# Patient Record
Sex: Male | Born: 1985 | Hispanic: Yes | Marital: Married | Smoking: Never smoker
Health system: Southern US, Community
[De-identification: ages and names within clinical notes are randomized; demographics above are authoritative.]

---

## 2013-06-14 ENCOUNTER — Other Ambulatory Visit: Payer: Self-pay | Admitting: Infectious Disease

## 2013-06-14 ENCOUNTER — Ambulatory Visit
Admission: RE | Admit: 2013-06-14 | Discharge: 2013-06-14 | Disposition: A | Discharge status: Home / Self Care | Payer: No Typology Code available for payment source | Source: Ambulatory Visit | Attending: Infectious Disease | Admitting: Infectious Disease

## 2013-06-14 DIAGNOSIS — A150 Tuberculosis of lung: Secondary | ICD-10-CM

## 2013-10-10 ENCOUNTER — Emergency Department (HOSPITAL_COMMUNITY): Payer: Self-pay

## 2013-10-10 ENCOUNTER — Emergency Department (HOSPITAL_COMMUNITY)
Admission: EM | Admit: 2013-10-10 | Discharge: 2013-10-10 | Disposition: A | Discharge status: Home / Self Care | Payer: Self-pay | Attending: Emergency Medicine | Admitting: Emergency Medicine

## 2013-10-10 ENCOUNTER — Encounter (HOSPITAL_COMMUNITY): Payer: Self-pay | Admitting: Emergency Medicine

## 2013-10-10 DIAGNOSIS — S022XXA Fracture of nasal bones, initial encounter for closed fracture: Secondary | ICD-10-CM | POA: Insufficient documentation

## 2013-10-10 DIAGNOSIS — S0083XA Contusion of other part of head, initial encounter: Secondary | ICD-10-CM | POA: Insufficient documentation

## 2013-10-10 DIAGNOSIS — F10929 Alcohol use, unspecified with intoxication, unspecified: Secondary | ICD-10-CM

## 2013-10-10 DIAGNOSIS — S0990XA Unspecified injury of head, initial encounter: Secondary | ICD-10-CM | POA: Insufficient documentation

## 2013-10-10 DIAGNOSIS — F1010 Alcohol abuse, uncomplicated: Secondary | ICD-10-CM | POA: Insufficient documentation

## 2013-10-10 DIAGNOSIS — S1093XA Contusion of unspecified part of neck, initial encounter: Secondary | ICD-10-CM

## 2013-10-10 DIAGNOSIS — Z23 Encounter for immunization: Secondary | ICD-10-CM | POA: Insufficient documentation

## 2013-10-10 DIAGNOSIS — S0180XA Unspecified open wound of other part of head, initial encounter: Secondary | ICD-10-CM | POA: Insufficient documentation

## 2013-10-10 DIAGNOSIS — S0181XA Laceration without foreign body of other part of head, initial encounter: Secondary | ICD-10-CM

## 2013-10-10 DIAGNOSIS — S0003XA Contusion of scalp, initial encounter: Secondary | ICD-10-CM | POA: Insufficient documentation

## 2013-10-10 LAB — CBC WITH DIFFERENTIAL/PLATELET
BASOS ABS: 0 10*3/uL (ref 0.0–0.1)
Basophils Relative: 0 % (ref 0–1)
Eosinophils Absolute: 0.1 10*3/uL (ref 0.0–0.7)
Eosinophils Relative: 1 % (ref 0–5)
HCT: 39.2 % (ref 39.0–52.0)
Hemoglobin: 13.4 g/dL (ref 13.0–17.0)
LYMPHS ABS: 1.8 10*3/uL (ref 0.7–4.0)
LYMPHS PCT: 23 % (ref 12–46)
MCH: 28.6 pg (ref 26.0–34.0)
MCHC: 34.2 g/dL (ref 30.0–36.0)
MCV: 83.6 fL (ref 78.0–100.0)
Monocytes Absolute: 0.4 10*3/uL (ref 0.1–1.0)
Monocytes Relative: 5 % (ref 3–12)
NEUTROS ABS: 5.7 10*3/uL (ref 1.7–7.7)
Neutrophils Relative %: 71 % (ref 43–77)
PLATELETS: 239 10*3/uL (ref 150–400)
RBC: 4.69 MIL/uL (ref 4.22–5.81)
RDW: 13.5 % (ref 11.5–15.5)
WBC: 8 10*3/uL (ref 4.0–10.5)

## 2013-10-10 LAB — RAPID URINE DRUG SCREEN, HOSP PERFORMED
Amphetamines: NOT DETECTED
Barbiturates: NOT DETECTED
Benzodiazepines: NOT DETECTED
COCAINE: NOT DETECTED
Opiates: NOT DETECTED
TETRAHYDROCANNABINOL: NOT DETECTED

## 2013-10-10 LAB — COMPREHENSIVE METABOLIC PANEL
ALK PHOS: 94 U/L (ref 39–117)
ALT: 14 U/L (ref 0–53)
AST: 29 U/L (ref 0–37)
Albumin: 3.8 g/dL (ref 3.5–5.2)
BUN: 12 mg/dL (ref 6–23)
CALCIUM: 8 mg/dL — AB (ref 8.4–10.5)
CHLORIDE: 100 meq/L (ref 96–112)
CO2: 22 meq/L (ref 19–32)
Creatinine, Ser: 0.88 mg/dL (ref 0.50–1.35)
GFR calc Af Amer: >90 mL/min (ref >90)
GFR calc non Af Amer: >90 mL/min (ref >90)
Glucose, Bld: 100 mg/dL — ABNORMAL HIGH (ref 70–99)
POTASSIUM: 3.2 meq/L — AB (ref 3.7–5.3)
SODIUM: 138 meq/L (ref 137–147)
Total Protein: 7.5 g/dL (ref 6.0–8.3)

## 2013-10-10 LAB — ETHANOL: Alcohol, Ethyl (B): 249 mg/dL — ABNORMAL HIGH (ref 0–11)

## 2013-10-10 MED ORDER — TRAMADOL HCL 50 MG PO TABS: 50.0000 mg | ORAL_TABLET | Freq: Four times a day (QID) | ORAL | Status: AC | PRN

## 2013-10-10 MED ORDER — SODIUM CHLORIDE 0.9 % IV BOLUS (SEPSIS)
1000.0000 mL | Freq: Once | INTRAVENOUS | Status: AC
  Administered 2013-10-10: 1000 mL via INTRAVENOUS

## 2013-10-10 MED ORDER — ONDANSETRON HCL 4 MG/2ML IJ SOLN
4.0000 mg | Freq: Once | INTRAMUSCULAR | Status: AC
  Administered 2013-10-10: 4 mg via INTRAVENOUS
  Filled 2013-10-10: qty 2

## 2013-10-10 MED ORDER — IBUPROFEN 600 MG PO TABS: 600.0000 mg | ORAL_TABLET | Freq: Four times a day (QID) | ORAL | Status: AC | PRN

## 2013-10-10 MED ORDER — TETANUS-DIPHTH-ACELL PERTUSSIS 5-2.5-18.5 LF-MCG/0.5 IM SUSP
0.5000 mL | Freq: Once | INTRAMUSCULAR | Status: AC
  Administered 2013-10-10: 0.5 mL via INTRAMUSCULAR
  Filled 2013-10-10: qty 0.5

## 2013-10-10 MED ORDER — ONDANSETRON 4 MG PO TBDP: ORAL_TABLET | ORAL | Status: AC

## 2013-10-10 MED ORDER — FENTANYL CITRATE 0.05 MG/ML IJ SOLN
50.0000 ug | Freq: Once | INTRAMUSCULAR | Status: AC
  Administered 2013-10-10: 50 ug via INTRAVENOUS
  Filled 2013-10-10: qty 2

## 2013-10-10 NOTE — ED Provider Notes (Signed)
LACERATION REPAIR Performed by: Dorthula Matas Authorized by: Dorthula Matas Consent: Verbal consent obtained. Risks and benefits: risks, benefits and alternatives were discussed Consent given by: patient Patient identity confirmed: provided demographic data Prepped and Draped in normal sterile fashion Wound explored  Laceration Location: multiple sites to face x 3 ( see Dr. Wonda Amis note)  Laceration Length:  1 cm  No Foreign Bodies seen or palpated  Anesthesia: local infiltration  Local anesthetic: lidocaine 2% wo epinephrine  Anesthetic total: 4 ml  Irrigation method: syringe Amount of cleaning: standard  Skin closure: sutures  Number of sutures: 2 sutures x two wounds and 1 sutures x one wound  Technique: simple interrupted  Patient tolerance: Patient tolerated the procedure well with no immediate complications.  For HPI, ROS, PE and dispo refer to Dr. Posey Rea note.   Dorthula Matas, PA-C 10/10/13 [phone]

## 2013-10-10 NOTE — ED Notes (Signed)
Pt reminded urine specimen is needed.  Tiffany, PA at bedside suturing.

## 2013-10-10 NOTE — ED Notes (Signed)
Pt aware urine specimen is needed. 

## 2013-10-10 NOTE — ED Provider Notes (Signed)
Medical screening examination/treatment/procedure(s) were performed by non-physician practitioner and as supervising physician I was immediately available for consultation/collaboration.   EKG Interpretation None        Windy Dudek, MD 10/10/13 0744 

## 2013-10-10 NOTE — ED Notes (Addendum)
Pt returned from CT °

## 2013-10-10 NOTE — ED Notes (Signed)
Pt informed of need for urine specimen  
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
## 2013-10-10 NOTE — ED Notes (Signed)
Ice pack given for eye- pt family reminded pt should not have anything to drink.

## 2013-10-10 NOTE — ED Notes (Signed)
Gave patient ginger ale and crackers for PO challenge.  

## 2013-10-10 NOTE — Discharge Instructions (Signed)
Followup with the ear, nose and throat MD in one week to reassess your nasal fractures. Call and make an appointment to followup with the ophthalmologist in the next few days to reassess your left eye. Return to the emergency department or your primary Dr. and 5-7 days to have your sutures removed. Return immediately for any worsening pain, focal weakness, vision changes, persistent vomiting or for any concerns.  Concusin - Adultos (Concussion, Adult) Una concusin, o traumatismo cerebral cerrado, es una lesin cerebral causada por un golpe directo en la cabeza o por un movimiento rpido y brusco sacudida) de la cabeza o el cuello. Generalmente no pone en peligro la vida. An as, los efectos de una concusin pueden ser graves. Si sufri una concusin antes, es ms probable que experimente sntomas similares a una concusin despus de un golpe directo en la cabeza.  CAUSAS   Un golpe directo en la cabeza, como al chocar contra otro jugador en un partido de ftbol, recibir un golpe en una lucha o golpearse la cabeza con una superficie dura.  Una sacudida de la cabeza o el cuello que hace que el cerebro se mueva de adelante hacia atrs dentro del crneo, como en un choque automovilstico. SIGNOS Y SNTOMAS  Los signos de una concusin pueden ser difciles de Chief Strategy Officer. En un primer momento, los pacientes, familiares y profesionales tal vez no los adviertan. Puede ser que aparentemente est normal pero que acte o se sienta diferente. Los sntomas son transitorios pero generalmente duran [address], [address] o an ms. Algunos sntomas pueden aparecer inmediatamente mientras otros pueden manifestarse despus de algunas horas o [address]  [address]. Cada lesin en la cabeza es diferente. Los sntomas son:   Dolor de cabeza leve a moderado, que no se [place].  Sensacin de presin dentro de la cabeza.  Presentar ms dificultad que lo habitual para:  Aprender o recordar cosas que ha escuchado.  Responder  preguntas.  Prestar atencin o concentrarse.  Organizar las tareas diarias.  Tomar decisiones y [employer].  Lentitud para pensar, actuar, hablar o leer.  Sentirse perdido o fcilmente confundido.  Sentirse cansado todo el tiempo o con falta de Engineer, drilling (fatigado).  Sentirse somnoliento.  Trastornos del sueo.  Dormir ms que lo habitual.  Dormir menos que lo habitual.  Problemas para conciliar el sueo.  Problemas para dormir (insomnio).  Prdida del equilibrio, sensacin de mareo.  Nuseas o vmitos.  Adormecimiento u hormigueo.  Mayor sensibilidad para:  Los sonidos.  Las luces.  Distracciones.  Problemas visuales o fcil cansancio en los ojos.  Prdida del sentido del gusto o Cabin crew.  Pitidos en el odo.  Cambios en el humor como sentirse triste o ansioso.  Irritacin, enojo por cosas pequeas o sin motivos.  Falta de motivacin.  Ver o escuchar cosas que otras personas no ven ni escuchan (alucinaciones). DIAGNSTICO  El mdico diagnosticar una concusin basndose en la descripcin del traumatismo y los sntomas. Le preguntar si sufri un desmayo (perdi la conciencia) y si tiene dificultad para recordar los sucesos ocurridos inmediatamente antes y Therapist, sports traumatismo.  La evaluacin tambin puede incluir:   Un escner cerebral para encontrar signos de lesin cerebral. Aunque los estudios no [employer] Sciences Corporation, igual puede haber sufrido una concusin.  Anlisis de sangre para asegurarse de que no hay otros problemas. TRATAMIENTO   La mayor parte de las concusiones se tratan en el servicio de emergencias o en el consultorio mdico. Es posible que Hydrologist en el hospital durante la noche  para completar el tratamiento.  Informe a su mdico si toma medicamentos, incluyendo los medicamentos recetados, medicamentos de venta libre y remedios naturales. Algunos medicamentos, como los anticoagulantes y la aspirina, pueden aumentar la  probabilidad de sufrir complicaciones. Tambin informe a su mdico si ha bebido alcohol o consume drogas. Esta informacin puede Licensed conveyancerafectar el tratamiento.  El profesional le dar el alta con algunas instrucciones que deber seguir.  La velocidad de recuperacin de una concusin depende de varios factores. Entre ellos se incluyen la gravedad de la concusin, la zona del cerebro lesionada, la edad y [place] estado de salud previo a la lesin.  La Harley-Davidsonmayora de las personas que han sufrido una lesin leve se recuperan completamente. La recuperacin puede llevar tiempo. En general, la recuperacin es ms lenta en las Smith Internationalpersonas mayores. A aquellas personas que ya han sufrido una concusin en el pasado, les llevar ms tiempo recuperarse de la lesin actual. INSTRUCCIONES PARA EL CUIDADO EN EL HOGAR  Instrucciones generales   Siga cuidadosamente las indicaciones que su mdico le ha dado.  Slo tome medicamentos de venta libre o recetados para Primary school teachercalmar el dolor, Environmental health practitionerel malestar o bajar la Newsomsfiebre, segn las indicaciones de su mdico.  Tome slo los medicamentos que su mdico le haya autorizado.  No beba alcohol hasta que su mdico lo autorice. El alcohol y otras sustancias pueden demorar su recuperacin y ponerlo en riesgo de nuevas lesiones.  Si le resulta ms difcil que lo habitual recordar las cosas, escrbalas.  Trate de hacer una cosa por vez si se distrae con facilidad. Por ejemplo, no mire televisin mientras prepara la cena.  Consulte con familiares y amigos si debe tomar decisiones importantes.  Cumpla con todas las visitas de control. Se recomienda realizar varias evaluaciones de los sntomas para favorecer su recuperacin.  Controle sus sntomas y pdale a otras personas que tambin lo hagan. En algunos casos pueden aparecer complicaciones luego de una concusin. Los ONEOKadultos mayores con lesin cerebral Conservator, museum/gallerypueden tener un mayor riesgo de complicaciones importantes como la formacin de un cogulo de sangre  en el cerebro.  Informe a los Kelly Servicesmaestros, al departamento de enfermera de la escuela, al consejero escolar, Conservation officer, historic buildingsentrenador o director acerca de los sntomas y Engineer, structuralrestricciones que tiene. Hgales saber lo que puede y lo que no Scientist, product/process developmentpuede hacer. Ellos debern observar:  Aumento en los problemas de atencin o Librarian, academicconcentracin.  Aumento en dificultades de memoria o en el aprendizaje de informacin nueva.  Aumento del tiempo que necesita para completar tareas o encargos.  Aumento de la irritabilidad o disminucin de la capacidad para Social workerenfrentar el estrs.  Aparicin de nuevos sntomas.  Reposo. El descanso favorece la curacin del cerebro. Asegrese de que:  Duerme bien por la noche. Evite quedarse despierto muy tarde por la noche.  Debe irse a dormir a la VF Corporationmisma hora los das de [address]man[address] y los fines de North Starsemana.  Descanse Administratordurante el da. Promueva las siestas durante el da o momentos de descanso cuando parece cansado.  Limite las actividades que requieren mucho atencin o Librarian, academicconcentracin. Ellas son:  Tareas para el hogar o trabajos relacionados con el empleo.  Mirar televisin.  Trabajar en la computadora.  Evite toda situacin en la que se pudiera producir otra lesin cerebral (ftbol, hockey, artes marciales, andar a caballo). El problema empeorar cada vez que experimente un sndrome postconmocional. Debe evitar estas actividades hasta que sea evaluado por el mdico correspondiente. Regreso a las Starwood Hotelsactividades habituales El regreso a las actividades habituales debe ser gradual. NewellEl  cuerpo y el cerebro necesitan su tiempo para recuperarse.  No retome la prctica de deportes ni otras actividades deportivas hasta que su mdico le diga que puede [place].  Consulte a su mdico sobre cundo puede conducir automviles, andar en bicicleta u operar maquinarias pesadas. La capacidad para reaccionar puede ser ms lenta luego de una lesin cerebral. Nunca realice estas actividades si se siente mareado.  Pregunte a su  mdico cuando podr volver a la escuela o al Aleen Campi. Prevencin de otra concusin Es muy importante que evite otro golpe en la cabeza, especialmente antes de que se haya recuperado. En casos raros, un nuevo traumatismo puede causar daos cerebrales permanentes, hinchazn del cerebro y [employer]. El riesgo es mayor durante los primeros 7 a 10 das despus de una lesin en la cabeza. Evite los traumatismos:   Use el cinturn de seguridad al conducir su automvil.  Beba alcohol slo con moderacin.  Use un casco cuando ande en bicicleta, esque, patine o realice actividades similares.  Evite actividades que podran causar una segunda conmocin cerebral, como deportes de contacto o recreativos hasta que su mdico lo autorice.  Implemente medidas de seguridad en el hogar.  Evite el desorden en pisos y escaleras.  Coloque barras en los baos y pasamanos en las escaleras.  Ponga alfombras antideslizantes en pisos y baeras.  Mejore la iluminacin en zonas de penumbra. SOLICITE ATENCIN MDICA SI:   Aumentan sus problemas de atencin o concentracin.  Aumentan las dificultades en la memoria o en el aprendizaje de informacin nueva.  Necesita ms tiempo que antes para completar las tareas o asignaciones.  Aumenta la irritabilidad o disminuye la capacidad para Social worker.  Tiene ms sntomas que antes. Solicite atencin mdica si presenta alguno de los siguientes sntomas durante ms de 2 semanas despus de su lesin:   Cefaleas que duran mucho (crnica).  Mareos o problemas con el equilibrio.  Nuseas.  Problemas de visin.  Aumento de la sensibilidad a los ruidos o a Statistician.  Depresin y cambios en el estado de nimo.  Ansiedad o irritabilidad.  Tiene problemas de [place].  Dificultad para concentrarse o Engineer, technical sales.  Problemas para dormir.  Cansancio permanente. SOLICITE ATENCIN MDICA DE INMEDIATO SI:   Los dolores de [place] son intensos o  empeoran. Esto puede ser un signo de que hay un cogulo en la cabeza.  Siente debilidad (an si es slo en Edison Simon, una pierna o parte del rostro).  Siente entumecimiento.  Le falta coordinacin.  Vomita repetidas veces.  Est ms somnoliento.  Una pupila est ms grande que la otra.  Sufre convulsiones.  Tiene dificultad para hablar.  Se siente repentinamente confuso. Esto puede ser un signo de que hay un cogulo en el cerebro.  Aumenta la confusin, la agitacin o la irritabilidad.  No puede Nutritional therapist o lugares.  Siente dolor en el cuello.  Le resulta difcil despertarse.  Tiene cambios de conducta inusuales.  Pierde la conciencia. ASEGRESE DE QUE:   Comprende estas instrucciones.  Controlar su afeccin.  Recibir ayuda de inmediato si no mejora o si empeora. Document Released: 04/11/2008 Document Revised: 12/30/2012 [facility] Patient Information 2014 [place], [place].  Laceracin facial (Facial Laceration)  Una laceracin facial es un corte en el rostro. Estas lesiones pueden ser dolorosas y [place]. Generalmente se curan rpido, pero puede ser necesario un cuidado especial para reducir las cicatrices. DIAGNSTICO  Su mdico realizar la historia clnica, preguntar detalles sobre cmo ocurri la lesin  y examinar la herida para determinar cun profundo es el corte. TRATAMIENTO  Algunas laceraciones faciales no requieren sutura. Otras laceraciones quizs no puedan cerrarse debido a un aumento del riesgo de infeccin. El riesgo de infeccin y la probabilidad de que la herida se cierre correctamente dependern de diversos factores, incluido el tiempo transcurrido desde que ocurri la lesin. La herida puede limpiarse para ayudar a prevenir una infeccin. Si la herida se cierra adecuadamente, podrn indicarle analgsicos, si los necesita. Su mdico usar puntos (suturas), pegamento para heridas Turner Daniels) o tiras [phone] para la piel para Building surveyor. Estos elementos mantendrn unidos los bordes de la piel para que se cure ms rpidamente y para Barista un mejor resultado cosmtico. Si es necesario, es posible que le administren una vacuna contra el ttanos. INSTRUCCIONES PARA EL CUIDADO EN EL HOGAR  Tome solo medicamentos de venta libre o recetados, segn las indicaciones del mdico.  Siga las indicaciones de su mdico para el cuidado de la herida. Estas indicaciones variarn segn la tcnica [place] para cerrar la herida. Si tiene suturas:  Mantenga la herida limpia y [place].  Si le colocaron una venda (vendaje), debe cambiarla al menos una vez al da. Adems, cambie el vendaje si este se moja o se ensucia, o segn las instrucciones de su mdico.  Lave la herida dos veces por da con agua y [place]. Enjuguela con agua para quitar todo el [place]. Seque dando palmaditas con una toalla limpia y seca.  Despus de limpiar, aplique una delgada capa del ungento con antibitico recomendado por su mdico. Esto le ayudar a prevenir las infecciones y a Automotive engineer que el vendaje se Building services engineer.  Puede ducharse despus de las primeras 24 horas. No moje la herida hasta que le hayan quitado las suturas.  Qutese las suturas segn las indicaciones de su mdico. Con las laceraciones faciales, las suturas normalmente deben retirarse despus de 4 a 5das para evitar las marcas de los puntos.  Espere algunos [employer] de que le hayan retirado las suturas antes de Garment/textile technologist. En caso que tenga tiras [id number] para la piel:  Mantenga la herida limpia y seca.  No deje que las tiras [address] se mojen. Puede darse un bao pero asegrese de que la herida se mantenga seca.  Si se moja, squela dando palmaditas con una toalla limpia.  Las tiras caern por s mismas. Puede recortar las tiras a medida que la herida se [place]. No quite las tiras [place] para la piel que an estn adheridas a la herida. Ellas se caern cuando sea el momento. En  caso que le hayan aplicado adhesivo:  Podr mojar la herida solo por un memento, en la ducha o el bao. No frote ni sumerja la herida. No practique natacin. Evite transpirar con abundancia hasta que el [place] para la piel se haya cado solo. Despus de ducharse o darse un bao, seque el corte dando palmaditas con una toalla limpia.  No aplique medicamentos lquidos, en crema o ungentos ni maquillaje en su herida mientras el [employer]ALCOMM para la piel est en su lugar. Podr aflojarlo antes de que la herida se cure.  Si tiene un vendaje, tenga cuidado de no aplicar cinta adhesiva directamente [employer]. Esto puede hacer que el [place] se caiga antes de que la herida se haya curado.  Evite la exposicin prolongada a Secretary/administrator o a las Sport and exercise psychologist el adhesivo para la piel se Arts administrator.  Por lo general, el [employer]COMM  para la Adult nurse durante 5 a 10das y Therapist, occupational. No quite la pelcula de [place]. Despus de la curacin: Una vez que la herida se haya curado, proteja la herida del sol durante un ao, colocando pantalla Statistician. Esto puede ayudar a reducir las cicatrices. La exposicin a los Hydrographic surveyor la Training and development officer. Pueden transcurrir entre uno y dosaos hasta que la cicatriz se cure completamente y pierda el color rojo.  SOLICITE ATENCIN MDICA DE INMEDIATO SI:  Tiene enrojecimiento, dolor o hinchazn alrededor de la herida.  Observa una secrecin de color blanco amarillento (pus) en la herida.  Tiene escalofros o fiebre. ASEGRESE DE QUE:  Comprende estas instrucciones.  Controlar su afeccin.  Recibir ayuda de inmediato si no mejora o si empeora. Document Released: 04/29/2005 Document Revised: 02/17/2013 [facility] Patient Information 2014 [place], [place].  Cuidados de Hotel manager - Adultos  (Laceration Care, Adult)  Una herida cortante es un corte o lesin  que atraviesa todas las capas de la piel y el tejido que se encuentra debajo de la piel.  TRATAMIENTO  Algunas laceraciones no requieren sutura. Algunas no deben cerrarse debido a que puede aumentar el riesgo de infeccin. Es importante que consulte al mdico lo antes posible despus de recibir una lesin para minimizar el riesgo de infeccin y aumentar la posibilidad de que se cierre con xito.  Cuando se cierra adecuadamente, podrn indicarle analgsicos, si los necesita. La herida debe limpiarse para combatir la infeccin. El mdico usar puntos (suturas), [employer], o tiras [place] para Environmental consultant. Estos elementos mantendrn unidos los bordes de la piel para que se cure ms rpidamente y para un mejor resultado cosmtico. Sin embargo, todas las heridas se curarn con una cicatriz. Una vez que la herida se haya curado, las cicatrices pueden minimizarse cubriendo la herida con pantalla solar durante el da por un lapso se 1 ao.  INSTRUCCIONES PARA EL CUIDADO EN EL HOGAR  Si tiene puntos o grapas:   Mantenga la herida limpia y [place].  Si tiene un (vendaje) cmbielo al menos una vez al da. Cmbielo si se moja o se ensucia, o segn las indicaciones del mdico.  Lave el corte dos veces por da con agua y [place]. Enjuguelo con agua para quitar todo el [place]. Seque dando palmaditas con una toalla limpia y seca.  Despus de limpiar, aplique una delgada capa de una crema con antibitico segn las indicaciones del mdico. Esto le ayudar a prevenir las infecciones y a Automotive engineer que el vendaje se Building services engineer.  Puede ducharse despus de las primeras 24 horas. No remoje la herida en agua hasta que le hayan quitado los puntos.  Solo tome medicamentos que se pueden comprar sin receta o recetados para Chief Technology Officer, Dentist o fiebre, como le indica el mdico.  Concurra para que le retiren los puntos o las grapas cuando el mdico le indique. En caso que tenga tiras [id number]:   Mantenga la herida  limpia y seca.  No deje que las tiras se mojen. Puede darse un bao cuidando de [employer] herida seca.  Si se moja, squela dando palmaditas con una toalla limpia.  Las tiras caern por s mismas. Puede recortar las tiras a medida que la herida se [place]. No quite las tiras que estn pegadas a la herida. Ellas se caern cuando sea el momento. En caso que le hayan [place].   Podr mojara momentneamente la herida en la ducha o  el bao. No frote ni sumerja la herida. No practique natacin. Evite transpirar con abundancia hasta que el [place] se haya cado. Despus de ducharse o darse un bao, seque el corte dando palmaditas con una toalla limpia.  No aplique medicamentos lquidos, en crema o ungentos mientras el QUALCOMM est en su lugar. Podr aflojarlo antes de que la herida se cure.  Si tiene un vendaje, tenga cuidado de no aplicar cinta adhesiva directamente [employer]. Esto puede hacer que el [place] se caiga antes de que la herida se haya curado.  Evite la exposicin prolongada a la luz del sol o a la International aid/development worker en QUALCOMM se Arts administrator. La exposicin a los Hydrographic surveyor la Training and development officer.  El [employer]ak la piel durante 5 a 10 das y Therapist, occupational. No quite la pelcula de [place]. Deber aplicarse la vacuna contra el ttanos si:  No recuerda cundo se coloc la vacuna la ltima vez.  Nunca recibi esta vacuna. Si le han aplicado la vacuna contra el ttanos, el brazo podr hincharse, enrojecer y sentirse caliente al tacto. Esto es frecuente y no es un problema. Si usted necesita aplicarse la vacuna y se niega a recibirla, corre riesgo de contraer ttanos. sta es una enfermedad grave.  SOLICITE ATENCIN MDICA SI:   Presenta enrojecimiento, hinchazn o aumento del dolor en la herida.  Hay rayas rojas que salen de la herida.  Observa un lquido blanco amarillento (pus) en la  herida.  Tiene fiebre.  Advierte un olor ftido que proviene de la herida o del vendaje.  La herida se abre luego de que le han extrado las suturas.  Nota que en la herida hay algn cuerpo extrao como un trozo de [place] o vidrio.  La herida est en su mano o pie y observa que no puede mover correctamente los dedos. SOLICITE ATENCIN MDICA DE INMEDIATO SI:   El dolor no se alivia con los [employer].  Hay una zona muy hinchada alrededor de la herida que le causa dolor y adormecimiento, o advierte un cambio en el color en el brazo, la mano, la pierna o el pie.  La herida se abre y sangra nuevamente.  Siente que el adormecimiento, la debilidad o la prdida de la funcin de la articulacin que rodea la herida [place].  Palpa ndulos dolorosos cerca de la herida o bajo la piel en cualquier zona del cuerpo. ASEGRESE DE QUE:   Comprende estas instrucciones.  Controlar su enfermedad.  Solicitar ayuda de inmediato si no mejora o si empeora. Document Released: 04/29/2005 Document Revised: 07/22/2011 [facility] Patient Information 2014 [place], [place].  Fractura de la Clinical cytogeneticist (Nasal Fracture)  La fractura nasal es la rotura de los huesos de la Clinical cytogeneticist. Neomia Dear fractura (quebradura de un hueso) menor generalmente se cura en un mes. Como consecuencia de la fractura de la nariz, los ojos generalmente se ponen negros. Esto no es un motivo de preocupacin. El problema de los ojos negros se resolver (desaparecer) despus de 1  2 semanas.  DIAGNSTICO Si teme que usted o su nio tienen la nariz fracturada, el [employer]oco[employer]ill[employer]. Es posible que las radiografas de la nariz no muestren la [place], aunque sta [place]. En algunos casos, el profesional debe [employer] 1 y [address] despus de la lesin para volver a Systems analyst nariz y Printmaker alineacin y tomar nuevas radiografas. Muchas veces el profesional debe esperar hasta que la hinchazn haya desaparecido. TRATAMIENTO  Una fractura  menor que no ha provocado una deformidad, generalmente no requiere TEFL teacher. Las fracturas ms graves, en las que los huesos se [place], puede requerir [place]. Esta tendr lugar despus que la hinchazn haya disminuido. En la ciruga se estabilizar y alinear la fractura.  INSTRUCCIONES PARA EL CUIDADO DOMICILIARIO  Aplique hielo sobre la zona lesionada.  Ponga el hielo en una bolsa plstica.  Colquese una toalla entre la piel y la bolsa de hielo.  Deje el hielo durante 15 a 20 minutos, 3 a 4 veces por da.  Tome los [employer] como le indic el profesional que lo asiste.  Utilice los medicamentos de venta libre o de prescripcin para Chief Technology Officer, Environmental health practitioner o la [place], segn se lo indique el profesional que lo asiste.  Si la C[employer] Corporation, comprima las partes blandas contra la pared central mientras permanece sentado en posicin erguida.durante 10 minutos.  Debe evitar los deportes de contacto durante al [employer] Corporation 3 o 4 semanas posteriores a una fractura nasal o hasta que el mdico lo autorice. SOLICITE ATENCIN MDICA SI:  El dolor aumenta o se vuelve muy intenso.  Sigue teniendo hemorragias nasales.  La forma de la nariz no vuelve a ser normal [employer] de [address].  Observa pus que proviene de la nariz. SOLICITE ATENCIN MDICA DE INMEDIATO SI:  Sufre una hemorragia nasal que no se detiene despus de 20 minutos de presionar los orificios nasales y de Scientific laboratory technician hielo en la Clinical cytogeneticist.  Observa que un lquido claro drena por la nariz.  Observa una hinchazn similar a una uva en el septum (la pared que divide las fosas nasales) Esto es una acumulacin de sangre (hematoma) que debe drenarse para prevenir la infeccin.  Tiene dificultad para mover los ojos.  Tiene vmitos continuos. Document Released: 02/06/2005 Document Revised: 07/22/2011 [facility] Patient Information 2014 [place], [place].

## 2013-10-10 NOTE — ED Notes (Signed)
Patient transported to CT 

## 2013-10-10 NOTE — ED Notes (Signed)
Tolerated PO Challenge tolerated without incident.
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
## 2013-10-10 NOTE — ED Notes (Signed)
Pt vomited during suturing.  MD made aware.

## 2013-10-10 NOTE — ED Notes (Signed)
Family at bedside.  PT is following commands given in spanish by family.  Pt denies pain at this time,  Sleepy but arousable.  Left eye swollen shut with some bruising. Pt remains in c-collar.  Pt has left lower lip lac.  Pt resting,  No other trauma noted on extremities.  Right pupil 4 RRB.

## 2013-10-10 NOTE — ED Provider Notes (Signed)
Patient signed out by Dr. Ranae Palms pending sobering up.  Patient awake and alert.  Complaining of facial pain.  Able to ambulate and PO without difficulty.  Patient to follow up with opthalmology and ENT per Dr. Ranae Palms.  I feel the patient has been appropriately medically screened and is safe for discharge home. Pertinent diagnoses were discussed with the patient. Patient was given return precautions.   Shon Baton, MD 10/10/13 1026

## 2013-10-15 ENCOUNTER — Encounter (HOSPITAL_COMMUNITY): Payer: Self-pay | Admitting: Emergency Medicine

## 2013-10-15 ENCOUNTER — Emergency Department (HOSPITAL_COMMUNITY)
Admission: EM | Admit: 2013-10-15 | Discharge: 2013-10-15 | Disposition: A | Discharge status: Home / Self Care | Payer: Self-pay | Attending: Emergency Medicine | Admitting: Emergency Medicine

## 2013-10-15 DIAGNOSIS — Z4802 Encounter for removal of sutures: Secondary | ICD-10-CM | POA: Insufficient documentation

## 2013-10-15 NOTE — ED Notes (Signed)
Pt here to have stitches removed above left eye and both corners of mouth. No discharge seen around wounds. Healing look appropriate. Nad, skin warm and dry, resp e/u.

## 2013-10-15 NOTE — ED Provider Notes (Signed)
CSN: [id number]     Arrival date & time 10/15/13  1638 History  This chart was scribed for non-physician practitioner Victor Captain, PA-C working with Victor Horn, MD by Victor Thomas, ED Scribe. This patient was seen in room TR07C/TR07C and the patient's care was started at 8:26 PM .   Chief Complaint  Patient presents with  . Suture / Staple Removal   The history is provided by a relative. No language interpreter was used.   HPI Comments: Victor Thomas is a 28 y.o. male who presents to the Emergency Department for suture removal. Pt is here to have suture removed from above L eye and corners of mouth. Denies discharge, pain, or redness from suture site, and denies pain to eye.   History reviewed. No pertinent past medical history. History reviewed. No pertinent past surgical history. No family history on file. History  Substance Use Topics  . Smoking status: Never Smoker   . Smokeless tobacco: Not on file  . Alcohol Use: Yes    Review of Systems  Eyes: Negative for pain.  Skin: Positive for wound. Negative for color change and rash.   Allergies  Review of patient's allergies indicates no known allergies.  Home Medications   Prior to Admission medications   Medication Sig Start Date End Date Taking? Authorizing Provider  ibuprofen (ADVIL,MOTRIN) 600 MG tablet Take 1 tablet (600 mg total) by mouth every 6 (six) hours as needed. 10/10/13   Victor Racer, MD  ondansetron (ZOFRAN ODT) 4 MG disintegrating tablet 4mg  ODT q4 hours prn nausea/vomit 10/10/13   Victor Racer, MD  traMADol (ULTRAM) 50 MG tablet Take 1 tablet (50 mg total) by mouth every 6 (six) hours as needed. 10/10/13   Victor Racer, MD   BP 140/80  Pulse 88  Temp(Src) 98.7 F (37.1 C) (Oral)  Resp 18  SpO2 98%  Physical Exam  Nursing note and vitals reviewed. Constitutional: He is oriented to person, place, and time. He appears well-developed and well-nourished. No distress.  HENT:  Head:  Normocephalic and atraumatic.  Eyes: EOM are normal.  Neck: Neck supple. No tracheal deviation present.  Cardiovascular: Normal rate.   Pulmonary/Chest: Effort normal. No respiratory distress.  Musculoskeletal: Normal range of motion.  Neurological: He is alert and oriented to person, place, and time.  Skin: Skin is warm and dry.  Psychiatric: He has a normal mood and affect. His behavior is normal.   ED Course  Procedures  DIAGNOSTIC STUDIES: Oxygen Saturation is 98% on RA, normal by my interpretation.    COORDINATION OF CARE: 8:28 PM-Discussed treatment plan which includes encouraged pt to use sunscreen and watch for signs of infection. Pt agreed to plan.   Labs Review Labs Reviewed - No data to display  Imaging Review No results found.   EKG Interpretation None       SUTURE REMOVAL Performed by: Victor Thomas  Consent: Verbal consent obtained. Patient identity confirmed: provided demographic data Time out: Immediately prior to procedure a "time out" was called to verify the correct patient, procedure, equipment, support staff and site/side marked as required.  Location details: eye and mouth  Wound Appearance: clean  Sutures/Staples Removed: 3   Facility: sutures placed in this facility Patient tolerance: Patient tolerated the procedure well with no immediate complications.    MDM   Final diagnoses:  None    Patient here for suture removal. These appear to be healing well and no signs of infection. Patient will be discharged with return  precautions and scar minimization material.  I personally performed the services described in this documentation, which was scribed in my presence. The recorded information has been reviewed and is accurate.    Victor CaptainAbigail Vernice Bowker, PA-C 10/21/13 1742

## 2013-10-15 NOTE — Discharge Instructions (Signed)
Minimizacin de las cicatrices  (Scar Minimization) Cada vez que se pase por una ciruga y se realice un corte en la piel, o le extirpan algo de la piel (lunares, cncer de piel, quiste), tendr Victor Thomas. Aunque las cicatrices son inevitables despus de la ciruga, hay maneras de minimizar su apariencia.  Es importante seguir todas las instrucciones que reciba del mdico sobre el cuidado de la herida. El modo en que se cura la herida influir en la apariencia de la Training and development officer. Si usted no sigue las instrucciones [employer] heridas, puede haber complicaciones, como infecciones. Las instrucciones pare el cuidado de las heridas incluyen la higiene de la herida y Victor Thomas, para que no se forme Victor Thomas. En algunas personas se forman cicatrices que son elevadas y abultadas (hipertrficas)o ms grandes que la herida inicial (queloides).  INSTRUCCIONES PARA EL CUIDADO EN EL HOGAR   Siga las instrucciones de cuidado de las heridas, segn las indicaciones.  Mantenga la herida limpia, lavndola con agua y [place].  Mantenga la herida hmeda con crema con antibitico o vaselina hasta que est completamente curada. Humedezca dos veces al da durante aproximadamente 2 semanas.  Concurra para que Victor Thomas puntos (suturas) en el momento programado.  Evite tocar o manipular la herida a menos que sea necesario. Lvese bien las manos antes y despus de tocar la herida.  Siga todas las restricciones, tales como lmites en el ejercicio o el [place]. Esto depende de dnde se encuentre la cicatriz.  Mantenga la cicatriz protegida de la exposicin al sol. Cubra la cicatriz con la proteccin solar / bloqueador solar con SPF 30 o superior.  Frote suavemente la herida con un movimiento circular para ayudar a Engineer, maintenance (IT) apariencia de la Training and development officer. Haga esto slo despus de que la herida se haya cerrado y todas las suturas se hayan eliminado.  Para las cicatrices hipertrficas o queloides,  hay varias maneras de tratar y minimizar su aparicin. Los mtodos incluyen la terapia de compresin, los corticoides intralesionales, la terapia con lser o la [place]. Estos mtodos los Geographical information systems officer mdico. Recuerde que la cicatriz puede aparecer ms clara o ms oscura que el color de la piel normal. Esta diferencia en el color debe igualarse con el tiempo.  SOLICITE ATENCIN MDICA SI:   Victor Thomas.  Aparecen signos de infeccin como dolor, enrojecimiento, pus y calor.  Tiene preguntas o preocupaciones. Document Released: 04/18/2011 Document Revised: 07/22/2011 [facility] Patient Information 2014 [place], [place].  Remocin de la sutura, cuidados posteriores (Suture Removal, Care After) Siga estas instrucciones durante las prximas semanas. Estas indicaciones le proporcionan informacin general acerca de cmo deber cuidarse despus del procedimiento. El mdico tambin podr darle instrucciones ms especficas. El tratamiento se ha planificado de acuerdo a las prcticas mdicas actuales, pero a veces se producen problemas. Comunquese con el mdico si tiene algn problema o tiene dudas despus del procedimiento. QU ESPERAR DESPUS DEL PROCEDIMIENTO Despus de que le retiren los puntos (suturas), es normal experimentar lo siguiente:  Molestias e hinchazn en la zona de la herida.  Leve enrojecimiento en la zona de la herida. INSTRUCCIONES PARA EL CUIDADO EN EL HOGAR   Si tiene bandas adhesivas en la piel sobre la zona de la herida, no las retire. Se caern solas en unos [employer]. Si las bandas [employer] siguen en su lugar despus de [address], entonces puede retirarlas.  Cambie los apsitos (vendajes), al menos, una vez al da o segn las instrucciones del mdico. Si el  vendaje se adhiere, remjelo con agua jabonosa tibia.  Solo aplique un ungento o crema segn las indicaciones del mdico. Si [place] crema o ungento, lave la zona con agua y [address]n dos veces por da para quitarlo todo.  Enjuague el jabn y seque suavemente la zona con una toalla limpia.  Mantenga la herida limpia y seca. Si el vendaje se moja, se ensucia o tiene mal olor, cmbielo tan pronto como pueda.  Contine protegiendo la herida de lesiones.  Use protector solar cuando salga al sol. Las cicatrices nuevas se broncean fcilmente. SOLICITE ATENCIN MDICA SI:  Aumenta el enrojecimiento, la hinchazn o el dolor en la zona afectada.  Observa que sale pus de la herida.  Tiene fiebre.  Advierte un olor ftido que proviene de la herida o del vendaje.  La herida se abre (los bordes se separan). Document Released: 02/06/2005 Document Revised: 02/17/2013 [facility] Patient Information 2014 [place].

## 2013-10-28 NOTE — ED Provider Notes (Signed)
Medical screening examination/treatment/procedure(s) were performed by non-physician practitioner and as supervising physician I was immediately available for consultation/collaboration.   EKG Interpretation None       Shaylea Ucci M Chou Busler, MD 10/28/13 1306 

## 2013-11-30 ENCOUNTER — Ambulatory Visit
Admission: RE | Admit: 2013-11-30 | Discharge: 2013-11-30 | Disposition: A | Discharge status: Home / Self Care | Payer: No Typology Code available for payment source | Source: Ambulatory Visit | Attending: Infectious Disease | Admitting: Infectious Disease

## 2013-11-30 ENCOUNTER — Other Ambulatory Visit: Payer: Self-pay | Admitting: Infectious Disease

## 2013-11-30 DIAGNOSIS — A150 Tuberculosis of lung: Secondary | ICD-10-CM

## 2014-11-30 IMAGING — CR DG CHEST 1V
1 series · 1 of 1 positions shown · non-contrast
Comparison: Chest x-ray of 07/02/2013

CLINICAL DATA: Completing treatment for tuberculosis

EXAM:
CHEST - 1 VIEW

[view not recorded]
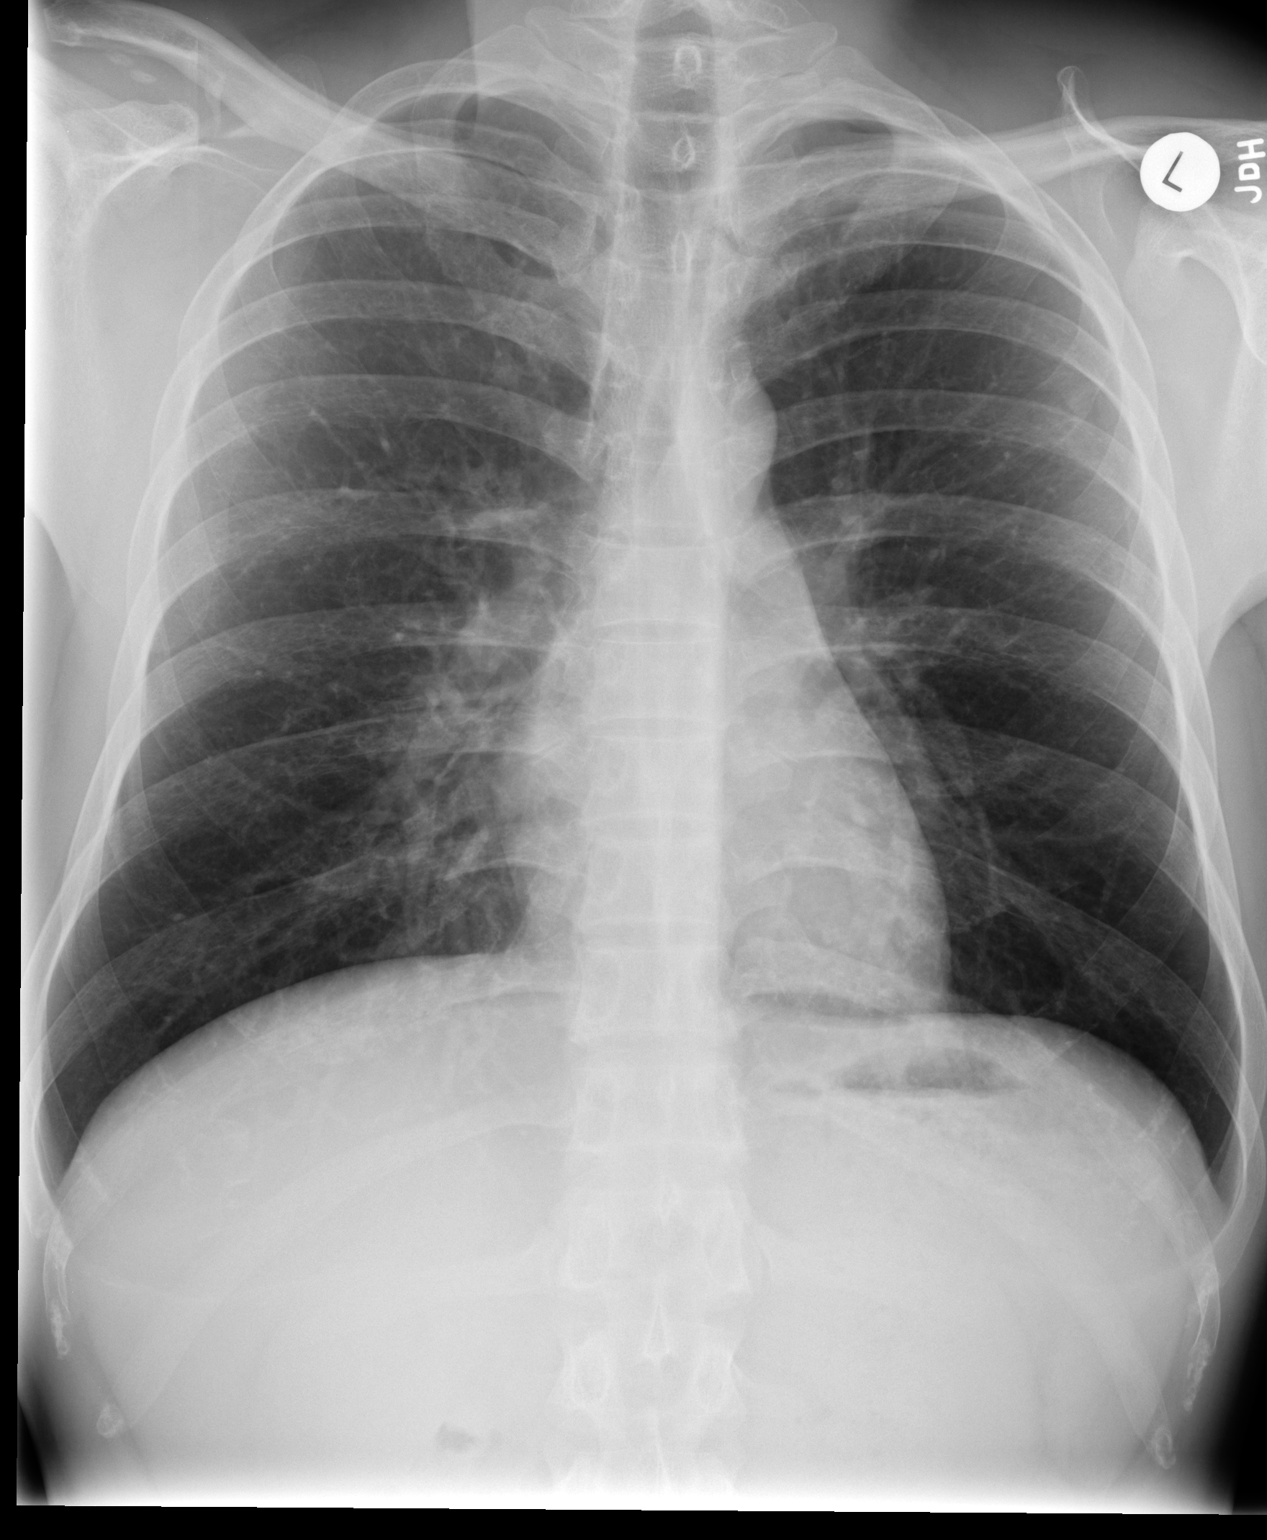

[1 of 1 positions shown; findings below may reference images not displayed]

FINDINGS: The previously noted infiltrate within the right upper lung field
extending toward the apex has completely resolved. A minimal opacity
remains peripherally in the left mid lung which may be chronic in
nature. No pleural effusion is seen. No adenopathy is noted. The
heart is within normal limits in size. No bony abnormality is seen.
IMPRESSION: Significant improvement with resolution of right upper lobe
infiltrate. Minimal remains peripherally in the left upper lung
field, possibly chronic.
